# Patient Record
Sex: Male | Born: 1955 | Race: Asian | Hispanic: No | Marital: Married | Smoking: Never smoker
Health system: Southern US, Community
[De-identification: ages and names within clinical notes are randomized; demographics above are authoritative.]

## PROBLEM LIST (undated history)

## (undated) DIAGNOSIS — Z87891 Personal history of nicotine dependence: Secondary | ICD-10-CM

---

## 2005-08-31 ENCOUNTER — Emergency Department (HOSPITAL_COMMUNITY): Admission: EM | Admit: 2005-08-31 | Discharge: 2005-08-31 | Payer: Self-pay | Admitting: Family Medicine

## 2013-02-02 ENCOUNTER — Ambulatory Visit: Payer: BC Managed Care – PPO | Admitting: Family Medicine

## 2013-02-02 VITALS — BP 124/70 | HR 58 | Temp 97.8°F | Resp 18 | Ht 63.5 in | Wt 172.0 lb

## 2013-02-02 DIAGNOSIS — R05 Cough: Secondary | ICD-10-CM

## 2013-02-02 DIAGNOSIS — J209 Acute bronchitis, unspecified: Secondary | ICD-10-CM

## 2013-02-02 LAB — POCT CBC
Granulocyte percent: 47.7 %G (ref 37–80)
MCV: 87.4 fL (ref 80–97)
MID (cbc): 0.5 (ref 0–0.9)
MPV: 9.5 fL (ref 0–99.8)
POC Granulocyte: 3.1 (ref 2–6.9)
POC MID %: 8.2 %M (ref 0–12)
Platelet Count, POC: 197 10*3/uL (ref 142–424)
RBC: 5.34 M/uL (ref 4.69–6.13)

## 2013-02-02 MED ORDER — DOXYCYCLINE HYCLATE 100 MG PO TABS: 100.0000 mg | ORAL_TABLET | Freq: Two times a day (BID) | ORAL | Status: DC

## 2013-02-02 NOTE — Progress Notes (Signed)
[facility] [address] [place] [address] [phone]  Date:  02/02/2013   Name:  David Mcclure   DOB:  September 02, 1955   MRN:  [id number]  PCP:  No primary provider on file.    Chief Complaint: headache and cough   History of Present Illness:  David Mcclure is a 57 y.o. very pleasant male patient who presents with the following:  Patient in today with complaints of cough, runny nose, and headache or the past 2 days. He states that he is also unable to sleep at night. Patient denies that anyone else in the home is sick. Denies any travel outside of the local area. Denies exposure to animals. Patient denies any outside involvement recently. Patient denies any fever, states that he has had chills. Patient is Vietmanese- visit was conducted with an interpreter in the room.  His interpreter has an ID card identifying himself as a certified interpreter.    David Mcclure is otherwise generally healthy per his knowledge, NKDA.  There is some language barrier even with his interpreter present.  He has been taking some sort of medication- in a "blue box- ? Aleve.  Not sure when his last dose was taken.    There are no active problems to display for this patient.   History reviewed. No pertinent past medical history.  History reviewed. No pertinent past surgical history.  History  Substance Use Topics  . Smoking status: Never Smoker   . Smokeless tobacco: Not on file  . Alcohol Use: No    History reviewed. No pertinent family history.  Not on File  Medication list has been reviewed and updated.  No current outpatient prescriptions on file prior to visit.   No current facility-administered medications on file prior to visit.    Review of Systems:  Const: Positive for chills and headache. Unsure if any fever. Denies fatigue. Eyes: Denies eye pain or vision changes  ENT/Mouth: Denies ear pain. Positive for congestion, rhinorrhea, and sneezing. Able to tolerate foods and  fluids well.  CV: Denies palpations and chest pain. Resp: Positive for cough, non-productive. Denies shortness of breath.  GI: Denies nausea, vomiting, diarrhea, constipation.  GU: Denies any problems with urinaration Musculoskeletal: Positive for joint pain in the wrist and elbow area, not new Skin: Denies any changes or abnormalities.    Physical Examination: Filed Vitals:   02/02/13 1352  BP: 124/70  Pulse: 62  Temp: 97.8 F (36.6 C)  Resp: 18   Filed Vitals:   02/02/13 1352  Height: 5' 3.5" (1.613 m)  Weight: 172 lb (78.019 kg)   Body mass index is 29.99 kg/(m^2). Ideal Body Weight: Weight in (lb) to have BMI = 25: 143.1   Results for orders placed in visit on 02/02/13 (from the past 24 hour(s))  POCT CBC     Status: Abnormal   Collection Time    02/02/13  2:55 PM      Result Value Range   WBC 6.6  4.6 - 10.2 K/uL   Lymph, poc 2.9  0.6 - 3.4   POC LYMPH PERCENT 44.1  10 - 50 %L   MID (cbc) 0.5  0 - 0.9   POC MID % 8.2  0 - 12 %M   POC Granulocyte 3.1  2 - 6.9   Granulocyte percent 47.7  37 - 80 %G   RBC 5.34  4.69 - 6.13 M/uL   Hemoglobin 14.4  14.1 - 18.1 g/dL   HCT, POC  46.7  43.5 - 53.7 %   MCV 87.4  80 - 97 fL   MCH, POC 27.0  27 - 31.2 pg   MCHC 30.8 (*) 31.8 - 35.4 g/dL   RDW, POC 16.1     Platelet Count, POC 197  142 - 424 K/uL   MPV 9.5  0 - 99.8 fL   GEN: WDWN, NAD, Non-toxic, A & O x 3, looks well HEENT: Atraumatic, Normocephalic. Neck supple, thyroid normal size. No masses, No LAD.  Oropharynx wnl Ears and Nose: No external deformity. Tympanic membranes clear and relective to light CV:  No M/G/R. No JVD. No thrill. No extra heart sounds heard.  PULM: CTA B, no wheezes, crackles, rhonchi No retractions. No resp. distress. No accessory muscle use. (Dry cough noted) ABD: S, NT, ND, +BS. No rebound. No HSM. EXTR: No c/c/e NEURO Normal gait.  PSYCH: Not depressed or anxious appearing.  Calm demeanor.    Assessment and Plan: Assessment-  Bronchitis  As language barrier is an issue with cover with abx. We cannot know if he is running a fever, and follow- up by phone will be difficult.  Will use doxycycline to make sure we also cover RMSF.    Plan- Doxcycline 100mg  BID x10days         - Rest, drink plenty of fluids        - Follow up if symptoms worsen   Signed Abbe Amsterdam, MD

## 2013-02-02 NOTE — Patient Instructions (Addendum)
Use the doxycycline for your cough and bronchitis.  If you are not better in the next few days please let us know- Sooner if worse.

## 2018-12-24 DIAGNOSIS — R509 Fever, unspecified: Secondary | ICD-10-CM | POA: Diagnosis not present

## 2018-12-24 DIAGNOSIS — R05 Cough: Secondary | ICD-10-CM | POA: Diagnosis not present

## 2018-12-24 DIAGNOSIS — J209 Acute bronchitis, unspecified: Secondary | ICD-10-CM | POA: Diagnosis not present

## 2018-12-24 DIAGNOSIS — Z20828 Contact with and (suspected) exposure to other viral communicable diseases: Secondary | ICD-10-CM | POA: Diagnosis not present

## 2018-12-27 ENCOUNTER — Other Ambulatory Visit: Payer: Self-pay

## 2018-12-27 ENCOUNTER — Emergency Department (HOSPITAL_COMMUNITY)
Admission: EM | Admit: 2018-12-27 | Discharge: 2018-12-27 | Disposition: A | Discharge status: Home / Self Care | Payer: Medicare Other | Attending: Emergency Medicine | Admitting: Emergency Medicine

## 2018-12-27 ENCOUNTER — Encounter (HOSPITAL_COMMUNITY): Payer: Self-pay | Admitting: Emergency Medicine

## 2018-12-27 DIAGNOSIS — I10 Essential (primary) hypertension: Secondary | ICD-10-CM | POA: Diagnosis not present

## 2018-12-27 DIAGNOSIS — U071 COVID-19: Secondary | ICD-10-CM

## 2018-12-27 DIAGNOSIS — R05 Cough: Secondary | ICD-10-CM | POA: Diagnosis not present

## 2018-12-27 DIAGNOSIS — Z87891 Personal history of nicotine dependence: Secondary | ICD-10-CM | POA: Diagnosis not present

## 2018-12-27 DIAGNOSIS — R5381 Other malaise: Secondary | ICD-10-CM | POA: Diagnosis not present

## 2018-12-27 DIAGNOSIS — R079 Chest pain, unspecified: Secondary | ICD-10-CM | POA: Diagnosis not present

## 2018-12-27 HISTORY — DX: Personal history of nicotine dependence: Z87.891

## 2018-12-27 MED ORDER — ACETAMINOPHEN 325 MG PO TABS
650.0000 mg | ORAL_TABLET | Freq: Once | ORAL | Status: AC
  Administered 2018-12-27: 650 mg via ORAL
  Filled 2018-12-27: qty 2

## 2018-12-27 NOTE — ED Notes (Addendum)
Pt does not speak english and was unable to select appropriate ;language on interpretor. Family contacted on phone and daughter in law acted as Engineer, technical sales. Pt states he was told he is covid positive and came for check up. Denies chest pain or shortness of breath, c/o headache.

## 2018-12-27 NOTE — ED Provider Notes (Signed)
[facility] Provider Note   CSN: [id number] Arrival date & time: 12/27/18  1050   History   Chief Complaint Chief Complaint  Patient presents with  . Chest Pain    HPI David Mcclure is a 63 y.o. male.     HPI daughter used as Nurse, learning disabilitytranslator as language unavailable on language line  63 year old male resents today for reevaluation.  Patient's daughter notes that on Tuesday he was seen in the clinic for upper respiratory symptoms.  He had a COVID test performed, they received a phone call today stating that he is COVID positive.  They note he has had intermittent fevers at home.  He reports that he was feeling worse earlier in the week and now feels much improved with no significant complaints presently.  He notes a minor cough, no significant shortness of breath.  EMS reports mentions of chest pain, I asked the patient several times throughout the evaluation he denies any chest pain.  He is a former smoker but has no chronic health conditions but has not had formal health evaluation previously.    Past Medical History:  Diagnosis Date  . Former smoker     There are no active problems to display for this patient.   No past surgical history on file.     Home Medications    Prior to Admission medications   Medication Sig Start Date End Date Taking? Authorizing Provider  doxycycline (VIBRA-TABS) 100 MG tablet Take 1 tablet (100 mg total) by mouth 2 (two) times daily. 02/02/13   Copland, Gwenlyn FoundJessica C, MD    Family History No family history on file.  Social History Social History   Tobacco Use  . Smoking status: Never Smoker  Substance Use Topics  . Alcohol use: No  . Drug use: No     Allergies   Patient has no known allergies.   Review of Systems Review of Systems  All other systems reviewed and are negative.    Physical Exam Updated Vital Signs BP (!) 182/95 (BP Location: Right Arm)   Pulse 77   Temp 99.9 F (37.7 C) (Oral)    Resp (!) 30   Ht 5\' 8"  (1.727 m)   Wt 79.4 kg   SpO2 96%   BMI 26.61 kg/m   Physical Exam Vitals signs and nursing note reviewed.  Constitutional:      Appearance: He is well-developed.  HENT:     Head: Normocephalic and atraumatic.  Eyes:     General: No scleral icterus.       Right eye: No discharge.        Left eye: No discharge.     Conjunctiva/sclera: Conjunctivae normal.     Pupils: Pupils are equal, round, and reactive to light.  Neck:     Musculoskeletal: Normal range of motion.     Vascular: No JVD.     Trachea: No tracheal deviation.  Cardiovascular:     Rate and Rhythm: Normal rate and regular rhythm.  Pulmonary:     Effort: Pulmonary effort is normal. No respiratory distress.     Breath sounds: Normal breath sounds. No stridor. No wheezing, rhonchi or rales.  Neurological:     Mental Status: He is alert and oriented to person, place, and time.     Coordination: Coordination normal.  Psychiatric:        Behavior: Behavior normal.        Thought Content: Thought content normal.  Judgment: Judgment normal.      ED Treatments / Results  Labs (all labs ordered are listed, but only abnormal results are displayed) Labs Reviewed - No data to display  EKG None  Radiology No results found.  Procedures Procedures (including critical care time)  Medications Ordered in ED Medications  acetaminophen (TYLENOL) tablet 650 mg (650 mg Oral Given 12/27/18 1127)     Initial Impression / Assessment and Plan / ED Course  I have reviewed the triage vital signs and the nursing notes.  Pertinent labs & imaging results that were available during my care of the patient were reviewed by me and considered in my medical decision making (see chart for details).         Assessment/Plan: 64 year old male presents today for recheck of symptoms.  Patient notes improvement in symptoms, he is slightly febrile here with no signs of significant lower respiratory  involvement or shortness of breath.  He has no diagnosed past medical history.  Have a positive COVID test as an outpatient.  Patient will continue outpatient management with Tylenol as needed for fever, rest, return to the emergency room if he develops any new or worsening signs or symptoms.  The patient's family verbalized understanding and agreement to today's plan and had no further questions or concerns at the time of discharge.   Final Clinical Impressions(s) / ED Diagnoses   Final diagnoses:  COVID-19  Hypertension, unspecified type    ED Discharge Orders    None       Rosalio Loud 12/27/18 1625    Cathren Laine, MD 12/28/18 1009

## 2018-12-27 NOTE — ED Triage Notes (Signed)
Per EMS- pt is from home, reports feeling weak and feverish for past week. Pt has a cough. Pt c.o. chest pain starting today. No PMH. Hypertensive with EMS.

## 2018-12-27 NOTE — Discharge Instructions (Addendum)
Please read attached information. If you experience any new or worsening signs or symptoms please return to the emergency room for evaluation. Please follow-up with your primary care provider or specialist as discussed.  Your blood pressure is elevated today please inform primary care provider of this and discuss treatment options.  Please use tylenol as needed for fever.

## 2018-12-27 NOTE — ED Notes (Signed)
DC instructions discussed at length with pt and daughter in law over phone. All questions answered. Family is coming for pt. Pt given po fluids and tolerating well.

## 2019-01-06 NOTE — Progress Notes (Signed)
Patient ID: Elisee Hoopes, male   DOB: [birth year], 63 y.o.   MRN: [id number]  Virtual Visit via Telephone Note  I connected with Nyquan Mcwatters on 01/07/19 at  9:30 AM EDT by telephone and verified that I am speaking with the correct person using two identifiers.   I discussed the limitations, risks, security and privacy concerns of performing an evaluation and management service by telephone and the availability of in person appointments. I also discussed with the patient that there may be a patient responsible charge related to this service. The patient expressed understanding and agreed to proceed.  Patient location:  home My Location:  [facility] office Persons on the call:  Myself, daughter translating, and the patient  History of Present Illness: After being seen in the ED 12/27/2018 with Covid-19.  Still with cough and some headache.  Poor appetite.  No vomiting or diarrhea.  He is drinking liquids and his daughter is trying to get him to drink more liquids.  No fever.  Definitely much improved but not well.    From ED note: 63 year old male resents today for reevaluation.  Patient's daughter notes that on Tuesday he was seen in the clinic for upper respiratory symptoms.  He had a COVID test performed, they received a phone call today stating that he is COVID positive.  They note he has had intermittent fevers at home.  He reports that he was feeling worse earlier in the week and now feels much improved with no significant complaints presently.  He notes a minor cough, no significant shortness of breath.  EMS reports mentions of chest pain, I asked the patient several times throughout the evaluation he denies any chest pain.  He is a former smoker but has no chronic health conditions but has not had formal health evaluation previously  From A/P: Assessment/Plan: 63 year old male presents today for recheck of symptoms.  Patient notes improvement in symptoms, he is slightly febrile here with no signs of  significant lower respiratory involvement or shortness of breath.  He has no diagnosed past medical history.  Have a positive COVID test as an outpatient.  Patient will continue outpatient management with Tylenol as needed for fever, rest, return to the emergency room if he develops any new or worsening signs or symptoms.  The patient's family verbalized understanding and agreement to today's plan and had no further questions or concerns at the time of discharge.    Observations/Objective: A&Ox3.     Assessment and Plan: 1. COVID-19 virus infection Much improved.  Fluids, hydration imperative.  Discussed adequate nutrition and hydration is of paramount importance.  Avoid NSAIDS.  Tylenol for HA, OTC cough meds ok.    2. Encounter for examination following treatment at hospital Much improved  3. Language barrier Jari Pigg attempted but family member interpretered used and additional time performing visit was required.   To ED if worsens  Follow Up Instructions: 1 month to assign PCP   I discussed the assessment and treatment plan with the patient. The patient was provided an opportunity to ask questions and all were answered. The patient agreed with the plan and demonstrated an understanding of the instructions.   The patient was advised to call back or seek an in-person evaluation if the symptoms worsen or if the condition fails to improve as anticipated.  I provided 12 minutes of non-face-to-face time during this encounter.   Georgian Co, PA-C

## 2019-01-07 ENCOUNTER — Ambulatory Visit: Payer: Medicare Other | Attending: Family Medicine | Admitting: Physician Assistant

## 2019-01-07 DIAGNOSIS — U071 COVID-19: Secondary | ICD-10-CM

## 2019-01-07 DIAGNOSIS — Z789 Other specified health status: Secondary | ICD-10-CM

## 2019-01-07 DIAGNOSIS — Z09 Encounter for follow-up examination after completed treatment for conditions other than malignant neoplasm: Secondary | ICD-10-CM | POA: Diagnosis not present

## 2019-01-07 DIAGNOSIS — Z603 Acculturation difficulty: Secondary | ICD-10-CM

## 2019-02-23 ENCOUNTER — Ambulatory Visit (HOSPITAL_COMMUNITY)
Admission: EM | Admit: 2019-02-23 | Discharge: 2019-02-23 | Disposition: A | Discharge status: Home / Self Care | Payer: Medicare Other | Attending: Urgent Care | Admitting: Urgent Care

## 2019-02-23 ENCOUNTER — Other Ambulatory Visit: Payer: Self-pay

## 2019-02-23 ENCOUNTER — Encounter (HOSPITAL_COMMUNITY): Payer: Self-pay

## 2019-02-23 DIAGNOSIS — R030 Elevated blood-pressure reading, without diagnosis of hypertension: Secondary | ICD-10-CM

## 2019-02-23 DIAGNOSIS — I10 Essential (primary) hypertension: Secondary | ICD-10-CM | POA: Diagnosis not present

## 2019-02-23 MED ORDER — AMLODIPINE BESYLATE 5 MG PO TABS: 5.0000 mg | ORAL_TABLET | Freq: Every day | ORAL | 0 refills | Status: AC

## 2019-02-23 NOTE — ED Provider Notes (Signed)
MRN: [id number] DOB: [birth year]  Subjective:   David Mcclure is a 63 y.o. male presenting for medication to help with his blood pressure.  Patient reports that he has been checking it on his own and has been high.  He does not have a PCP.  He would like to get a medication for his blood pressure.  Denies smoking cigarettes or drinking alcohol.  Denies having any particular symptoms.  However, he has had an intermittent mild headache in the past week.  No current facility-administered medications for this encounter.  No current outpatient medications on file.   No Known Allergies  Past Medical History:  Diagnosis Date  . Former smoker      History reviewed. No pertinent surgical history.  Review of Systems  Constitutional: Negative for fever and malaise/fatigue.  HENT: Negative for congestion, ear pain, sinus pain and sore throat.   Eyes: Negative for blurred vision, double vision, discharge and redness.  Respiratory: Negative for cough, hemoptysis, shortness of breath and wheezing.   Cardiovascular: Negative for chest pain.  Gastrointestinal: Negative for abdominal pain, diarrhea, nausea and vomiting.  Genitourinary: Negative for dysuria, flank pain and hematuria.  Musculoskeletal: Negative for myalgias.  Skin: Negative for rash.  Neurological: Negative for dizziness, weakness and headaches.  Psychiatric/Behavioral: Negative for depression and substance abuse.    Objective:   Vitals: BP (!) 146/87 (BP Location: Left Arm)   Pulse 83   Temp 99 F (37.2 C) (Oral)   Resp 17   SpO2 97%   BP Readings from Last 3 Encounters:  02/23/19 (!) 146/87  12/27/18 (!) 182/95  02/02/13 124/70   Physical Exam Constitutional:      General: He is not in acute distress.    Appearance: Normal appearance. He is well-developed. He is not ill-appearing, toxic-appearing or diaphoretic.  HENT:     Head: Normocephalic and atraumatic.     Right Ear: External ear normal.     Left Ear: External  ear normal.     Nose: Nose normal.     Mouth/Throat:     Mouth: Mucous membranes are moist.     Pharynx: Oropharynx is clear.  Eyes:     General: No scleral icterus.    Extraocular Movements: Extraocular movements intact.     Pupils: Pupils are equal, round, and reactive to light.  Cardiovascular:     Rate and Rhythm: Normal rate and regular rhythm.     Heart sounds: Normal heart sounds. No murmur. No friction rub. No gallop.   Pulmonary:     Effort: Pulmonary effort is normal. No respiratory distress.     Breath sounds: Normal breath sounds. No stridor. No wheezing, rhonchi or rales.  Neurological:     General: No focal deficit present.     Mental Status: He is alert and oriented to person, place, and time.     Cranial Nerves: No cranial nerve deficit.     Motor: No weakness.     Coordination: Coordination normal.     Gait: Gait normal.     Deep Tendon Reflexes: Reflexes normal.  Psychiatric:        Mood and Affect: Mood normal.        Behavior: Behavior normal.        Thought Content: Thought content normal.     Assessment and Plan :   1. Essential hypertension   2. Elevated blood pressure reading     We will have patient start amlodipine.  Counseled on need for dietary  modifications. Counseled on need to establish with a PCP for further work-up and follow-up.  I placed patient in a work you with [facility] for PCP assistance. Counseled patient on potential for adverse effects with medications prescribed/recommended today, ER and return-to-clinic precautions discussed, patient verbalized understanding.     Wallis BambergMani, Eltha Tingley, PA-C 02/23/19 1755

## 2019-02-23 NOTE — Discharge Instructions (Addendum)
For elevated blood pressure, make sure you are monitoring salt in your diet.  Do not eat restaurant foods and limit processed foods at home.  Processed foods include things like frozen meals preseason meats and dinners.  Make sure your pain attention to sodium labels on foods you by at the grocery store.  For seasoning you can use a brand called Mrs. Dash which includes a lot of salt free seasonings. ° °Salads - kale, spinach, cabbage, spring mix; use seeds like pumpkin seeds or sunflower seeds, almonds; you can also use 1-2 hard boiled eggs in your salads °Fruits - avocadoes, berries (blueberries, raspberries, blackberries), apples, oranges, pomegranate, grapefruit °Vegetables - aspargus, cauliflower, broccoli, green beans, brussel spouts, bell peppers; stay away from starchy vegetables like potatoes, carrots, peas ° °

## 2019-02-23 NOTE — ED Triage Notes (Signed)
Patient presents to Urgent Care with complaints of hypertension since measuring it in the pharmacy today. Patient reports he has a slight headache as well.

## 2019-03-20 DIAGNOSIS — Z131 Encounter for screening for diabetes mellitus: Secondary | ICD-10-CM | POA: Diagnosis not present

## 2019-03-20 DIAGNOSIS — Z136 Encounter for screening for cardiovascular disorders: Secondary | ICD-10-CM | POA: Diagnosis not present

## 2019-03-20 DIAGNOSIS — Z1159 Encounter for screening for other viral diseases: Secondary | ICD-10-CM | POA: Diagnosis not present

## 2019-03-20 DIAGNOSIS — E7800 Pure hypercholesterolemia, unspecified: Secondary | ICD-10-CM | POA: Diagnosis not present

## 2019-03-20 DIAGNOSIS — R5383 Other fatigue: Secondary | ICD-10-CM | POA: Diagnosis not present

## 2019-03-20 DIAGNOSIS — E559 Vitamin D deficiency, unspecified: Secondary | ICD-10-CM | POA: Diagnosis not present

## 2019-03-20 DIAGNOSIS — Z0000 Encounter for general adult medical examination without abnormal findings: Secondary | ICD-10-CM | POA: Diagnosis not present

## 2019-03-20 DIAGNOSIS — M129 Arthropathy, unspecified: Secondary | ICD-10-CM | POA: Diagnosis not present

## 2019-03-20 DIAGNOSIS — Z79899 Other long term (current) drug therapy: Secondary | ICD-10-CM | POA: Diagnosis not present

## 2019-04-03 DIAGNOSIS — R030 Elevated blood-pressure reading, without diagnosis of hypertension: Secondary | ICD-10-CM | POA: Diagnosis not present

## 2019-04-03 DIAGNOSIS — R413 Other amnesia: Secondary | ICD-10-CM | POA: Diagnosis not present

## 2019-04-03 DIAGNOSIS — M79605 Pain in left leg: Secondary | ICD-10-CM | POA: Diagnosis not present

## 2019-04-03 DIAGNOSIS — E7800 Pure hypercholesterolemia, unspecified: Secondary | ICD-10-CM | POA: Diagnosis not present

## 2019-04-16 DIAGNOSIS — E7800 Pure hypercholesterolemia, unspecified: Secondary | ICD-10-CM | POA: Diagnosis not present

## 2019-04-16 DIAGNOSIS — J3089 Other allergic rhinitis: Secondary | ICD-10-CM | POA: Diagnosis not present

## 2019-04-16 DIAGNOSIS — R0989 Other specified symptoms and signs involving the circulatory and respiratory systems: Secondary | ICD-10-CM | POA: Diagnosis not present

## 2019-04-16 DIAGNOSIS — R946 Abnormal results of thyroid function studies: Secondary | ICD-10-CM | POA: Diagnosis not present

## 2019-04-16 DIAGNOSIS — M79605 Pain in left leg: Secondary | ICD-10-CM | POA: Diagnosis not present

## 2019-04-16 DIAGNOSIS — M109 Gout, unspecified: Secondary | ICD-10-CM | POA: Diagnosis not present

## 2019-04-16 DIAGNOSIS — M79604 Pain in right leg: Secondary | ICD-10-CM | POA: Diagnosis not present

## 2019-05-16 ENCOUNTER — Emergency Department (HOSPITAL_COMMUNITY)
Admission: EM | Admit: 2019-05-16 | Discharge: 2019-05-16 | Disposition: A | Discharge status: Home / Self Care | Payer: Medicare Other | Attending: Emergency Medicine | Admitting: Emergency Medicine

## 2019-05-16 ENCOUNTER — Encounter (HOSPITAL_COMMUNITY): Payer: Self-pay

## 2019-05-16 ENCOUNTER — Emergency Department (HOSPITAL_COMMUNITY): Payer: Medicare Other

## 2019-05-16 ENCOUNTER — Other Ambulatory Visit: Payer: Self-pay

## 2019-05-16 DIAGNOSIS — Z79899 Other long term (current) drug therapy: Secondary | ICD-10-CM | POA: Insufficient documentation

## 2019-05-16 DIAGNOSIS — Y999 Unspecified external cause status: Secondary | ICD-10-CM | POA: Insufficient documentation

## 2019-05-16 DIAGNOSIS — M25512 Pain in left shoulder: Secondary | ICD-10-CM | POA: Insufficient documentation

## 2019-05-16 DIAGNOSIS — Y9389 Activity, other specified: Secondary | ICD-10-CM | POA: Insufficient documentation

## 2019-05-16 DIAGNOSIS — Y92410 Unspecified street and highway as the place of occurrence of the external cause: Secondary | ICD-10-CM | POA: Diagnosis not present

## 2019-05-16 DIAGNOSIS — S20211A Contusion of right front wall of thorax, initial encounter: Secondary | ICD-10-CM | POA: Insufficient documentation

## 2019-05-16 DIAGNOSIS — S0990XA Unspecified injury of head, initial encounter: Secondary | ICD-10-CM | POA: Diagnosis not present

## 2019-05-16 DIAGNOSIS — S299XXA Unspecified injury of thorax, initial encounter: Secondary | ICD-10-CM | POA: Diagnosis not present

## 2019-05-16 DIAGNOSIS — Z87891 Personal history of nicotine dependence: Secondary | ICD-10-CM | POA: Diagnosis not present

## 2019-05-16 DIAGNOSIS — R519 Headache, unspecified: Secondary | ICD-10-CM | POA: Insufficient documentation

## 2019-05-16 DIAGNOSIS — I10 Essential (primary) hypertension: Secondary | ICD-10-CM | POA: Diagnosis not present

## 2019-05-16 DIAGNOSIS — M542 Cervicalgia: Secondary | ICD-10-CM | POA: Insufficient documentation

## 2019-05-16 DIAGNOSIS — R52 Pain, unspecified: Secondary | ICD-10-CM | POA: Diagnosis not present

## 2019-05-16 DIAGNOSIS — M25519 Pain in unspecified shoulder: Secondary | ICD-10-CM | POA: Diagnosis not present

## 2019-05-16 MED ORDER — NAPROXEN 375 MG PO TABS: 375.0000 mg | ORAL_TABLET | Freq: Two times a day (BID) | ORAL | 0 refills | Status: AC

## 2019-05-16 MED ORDER — HYDROCODONE-ACETAMINOPHEN 5-325 MG PO TABS
1.0000 | ORAL_TABLET | Freq: Once | ORAL | Status: AC
  Administered 2019-05-16: 07:00:00 1 via ORAL
  Filled 2019-05-16: qty 1

## 2019-05-16 MED ORDER — ACETAMINOPHEN ER 650 MG PO TBCR: 650.0000 mg | EXTENDED_RELEASE_TABLET | Freq: Three times a day (TID) | ORAL | 0 refills | Status: DC | PRN
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
## 2019-05-16 NOTE — ED Notes (Signed)
Pt was verbalized discharge instructions. Pt had no further questions at this time. NAD. 

## 2019-05-16 NOTE — ED Notes (Signed)
Pt ambulated to the bathroom without assistance. Gait steady  

## 2019-08-21 DIAGNOSIS — Z131 Encounter for screening for diabetes mellitus: Secondary | ICD-10-CM | POA: Diagnosis not present

## 2019-08-21 DIAGNOSIS — M109 Gout, unspecified: Secondary | ICD-10-CM | POA: Diagnosis not present

## 2019-08-21 DIAGNOSIS — Z1159 Encounter for screening for other viral diseases: Secondary | ICD-10-CM | POA: Diagnosis not present

## 2019-08-21 DIAGNOSIS — Z79899 Other long term (current) drug therapy: Secondary | ICD-10-CM | POA: Diagnosis not present

## 2019-08-21 DIAGNOSIS — M79605 Pain in left leg: Secondary | ICD-10-CM | POA: Diagnosis not present

## 2019-08-21 DIAGNOSIS — E7800 Pure hypercholesterolemia, unspecified: Secondary | ICD-10-CM | POA: Diagnosis not present

## 2019-10-02 DIAGNOSIS — Z20822 Contact with and (suspected) exposure to covid-19: Secondary | ICD-10-CM | POA: Diagnosis not present

## 2019-10-02 DIAGNOSIS — Z9189 Other specified personal risk factors, not elsewhere classified: Secondary | ICD-10-CM | POA: Diagnosis not present

## 2019-10-03 DIAGNOSIS — E559 Vitamin D deficiency, unspecified: Secondary | ICD-10-CM | POA: Diagnosis not present

## 2019-10-03 DIAGNOSIS — E7800 Pure hypercholesterolemia, unspecified: Secondary | ICD-10-CM | POA: Diagnosis not present

## 2019-10-03 DIAGNOSIS — I10 Essential (primary) hypertension: Secondary | ICD-10-CM | POA: Diagnosis not present

## 2019-10-03 DIAGNOSIS — Z79899 Other long term (current) drug therapy: Secondary | ICD-10-CM | POA: Diagnosis not present

## 2019-10-03 DIAGNOSIS — Z1159 Encounter for screening for other viral diseases: Secondary | ICD-10-CM | POA: Diagnosis not present

## 2019-10-03 DIAGNOSIS — R030 Elevated blood-pressure reading, without diagnosis of hypertension: Secondary | ICD-10-CM | POA: Diagnosis not present

## 2019-10-14 DIAGNOSIS — R9431 Abnormal electrocardiogram [ECG] [EKG]: Secondary | ICD-10-CM | POA: Diagnosis not present

## 2019-11-02 DIAGNOSIS — M79604 Pain in right leg: Secondary | ICD-10-CM | POA: Diagnosis not present

## 2019-11-02 DIAGNOSIS — E7800 Pure hypercholesterolemia, unspecified: Secondary | ICD-10-CM | POA: Diagnosis not present

## 2019-11-02 DIAGNOSIS — M79605 Pain in left leg: Secondary | ICD-10-CM | POA: Diagnosis not present

## 2019-11-02 DIAGNOSIS — I10 Essential (primary) hypertension: Secondary | ICD-10-CM | POA: Diagnosis not present

## 2019-12-30 DIAGNOSIS — M79604 Pain in right leg: Secondary | ICD-10-CM | POA: Diagnosis not present

## 2019-12-30 DIAGNOSIS — E7800 Pure hypercholesterolemia, unspecified: Secondary | ICD-10-CM | POA: Diagnosis not present

## 2019-12-30 DIAGNOSIS — I10 Essential (primary) hypertension: Secondary | ICD-10-CM | POA: Diagnosis not present

## 2019-12-30 DIAGNOSIS — M79605 Pain in left leg: Secondary | ICD-10-CM | POA: Diagnosis not present

## 2020-01-29 DIAGNOSIS — R5383 Other fatigue: Secondary | ICD-10-CM | POA: Diagnosis not present

## 2020-01-29 DIAGNOSIS — E7800 Pure hypercholesterolemia, unspecified: Secondary | ICD-10-CM | POA: Diagnosis not present

## 2020-01-29 DIAGNOSIS — Z79899 Other long term (current) drug therapy: Secondary | ICD-10-CM | POA: Diagnosis not present

## 2020-01-29 DIAGNOSIS — Z1159 Encounter for screening for other viral diseases: Secondary | ICD-10-CM | POA: Diagnosis not present

## 2020-01-29 DIAGNOSIS — I10 Essential (primary) hypertension: Secondary | ICD-10-CM | POA: Diagnosis not present

## 2020-01-29 DIAGNOSIS — E559 Vitamin D deficiency, unspecified: Secondary | ICD-10-CM | POA: Diagnosis not present
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
## 2020-04-25 DIAGNOSIS — Z9189 Other specified personal risk factors, not elsewhere classified: Secondary | ICD-10-CM | POA: Diagnosis not present

## 2020-04-25 DIAGNOSIS — R519 Headache, unspecified: Secondary | ICD-10-CM | POA: Diagnosis not present

## 2020-05-09 DIAGNOSIS — E559 Vitamin D deficiency, unspecified: Secondary | ICD-10-CM | POA: Diagnosis not present

## 2020-05-09 DIAGNOSIS — E7800 Pure hypercholesterolemia, unspecified: Secondary | ICD-10-CM | POA: Diagnosis not present

## 2020-05-09 DIAGNOSIS — M129 Arthropathy, unspecified: Secondary | ICD-10-CM | POA: Diagnosis not present

## 2020-05-09 DIAGNOSIS — I10 Essential (primary) hypertension: Secondary | ICD-10-CM | POA: Diagnosis not present

## 2020-05-09 DIAGNOSIS — Z1159 Encounter for screening for other viral diseases: Secondary | ICD-10-CM | POA: Diagnosis not present

## 2020-05-09 DIAGNOSIS — Z23 Encounter for immunization: Secondary | ICD-10-CM | POA: Diagnosis not present

## 2020-05-09 DIAGNOSIS — R5383 Other fatigue: Secondary | ICD-10-CM | POA: Diagnosis not present

## 2020-05-09 DIAGNOSIS — Z79899 Other long term (current) drug therapy: Secondary | ICD-10-CM | POA: Diagnosis not present

## 2020-05-09 DIAGNOSIS — Z0000 Encounter for general adult medical examination without abnormal findings: Secondary | ICD-10-CM | POA: Diagnosis not present

## 2023-09-12 ENCOUNTER — Ambulatory Visit: Payer: 59 | Attending: Audiology | Admitting: Audiology

## 2023-10-09 ENCOUNTER — Emergency Department (HOSPITAL_COMMUNITY)
Admission: EM | Admit: 2023-10-09 | Discharge: 2023-10-09 | Disposition: A | Discharge status: Home / Self Care | Payer: 59 | Attending: Emergency Medicine | Admitting: Emergency Medicine

## 2023-10-09 ENCOUNTER — Encounter (HOSPITAL_COMMUNITY): Payer: Self-pay

## 2023-10-09 ENCOUNTER — Emergency Department (HOSPITAL_COMMUNITY): Payer: 59

## 2023-10-09 ENCOUNTER — Other Ambulatory Visit: Payer: Self-pay

## 2023-10-09 DIAGNOSIS — I159 Secondary hypertension, unspecified: Secondary | ICD-10-CM | POA: Diagnosis not present

## 2023-10-09 DIAGNOSIS — Z79899 Other long term (current) drug therapy: Secondary | ICD-10-CM | POA: Insufficient documentation

## 2023-10-09 DIAGNOSIS — R519 Headache, unspecified: Secondary | ICD-10-CM

## 2023-10-09 DIAGNOSIS — B349 Viral infection, unspecified: Secondary | ICD-10-CM | POA: Diagnosis not present

## 2023-10-09 DIAGNOSIS — R509 Fever, unspecified: Secondary | ICD-10-CM | POA: Insufficient documentation

## 2023-10-09 LAB — BASIC METABOLIC PANEL
Anion gap: 11 (ref 5–15)
BUN: 17 mg/dL (ref 8–23)
CO2: 25 mmol/L (ref 22–32)
Calcium: 9.1 mg/dL (ref 8.9–10.3)
Chloride: 98 mmol/L (ref 98–111)
Creatinine, Ser: 1.54 mg/dL — ABNORMAL HIGH (ref 0.61–1.24)
GFR, Estimated: 48 mL/min — ABNORMAL LOW (ref >60)
Glucose, Bld: 120 mg/dL — ABNORMAL HIGH (ref 70–99)
Potassium: 3.6 mmol/L (ref 3.5–5.1)
Sodium: 134 mmol/L — ABNORMAL LOW (ref 135–145)

## 2023-10-09 LAB — CBC WITH DIFFERENTIAL/PLATELET
Abs Immature Granulocytes: 0.03 10*3/uL (ref 0.00–0.07)
Basophils Absolute: 0.1 10*3/uL (ref 0.0–0.1)
Basophils Relative: 0 %
Eosinophils Absolute: 0 10*3/uL (ref 0.0–0.5)
Eosinophils Relative: 0 %
HCT: 42.6 % (ref 39.0–52.0)
Hemoglobin: 14 g/dL (ref 13.0–17.0)
Immature Granulocytes: 0 %
Lymphocytes Relative: 17 %
Lymphs Abs: 2.1 10*3/uL (ref 0.7–4.0)
MCH: 26.5 pg (ref 26.0–34.0)
MCHC: 32.9 g/dL (ref 30.0–36.0)
MCV: 80.5 fL (ref 80.0–100.0)
Monocytes Absolute: 1 10*3/uL (ref 0.1–1.0)
Monocytes Relative: 8 %
Neutro Abs: 9.2 10*3/uL — ABNORMAL HIGH (ref 1.7–7.7)
Neutrophils Relative %: 75 %
Platelets: 200 10*3/uL (ref 150–400)
RBC: 5.29 MIL/uL (ref 4.22–5.81)
RDW: 12.9 % (ref 11.5–15.5)
WBC: 12.3 10*3/uL — ABNORMAL HIGH (ref 4.0–10.5)
nRBC: 0 % (ref 0.0–0.2)

## 2023-10-09 LAB — RESP PANEL BY RT-PCR (RSV, FLU A&B, COVID)  RVPGX2
Influenza A by PCR: NEGATIVE
Influenza B by PCR: NEGATIVE
Resp Syncytial Virus by PCR: NEGATIVE
SARS Coronavirus 2 by RT PCR: NEGATIVE

## 2023-10-09 MED ORDER — ACETAMINOPHEN 500 MG PO TABS: 500.0000 mg | ORAL_TABLET | ORAL | 0 refills | Status: AC | PRN

## 2023-10-09 MED ORDER — ACETAMINOPHEN 325 MG PO TABS
650.0000 mg | ORAL_TABLET | Freq: Once | ORAL | Status: AC
  Administered 2023-10-09: 650 mg via ORAL
  Filled 2023-10-09: qty 2

## 2023-10-09 NOTE — ED Provider Triage Note (Signed)
 Emergency Medicine Provider Triage Evaluation Note  Rocco Fusselman , a 68 y.o. male  was evaluated in triage.  Pt complains of headache and high blood pressure.  Reports compliance on blood pressure medication.  States he developed a headache last night, not worse headache of his life.  Denies chest pain, shortness of breath or blurred vision.  Reports he has pain "all over my body".  Denies known sick contacts.  Denies fevers at home.  Review of Systems  Positive:  Negative:   Physical Exam  BP (!) 175/93 (BP Location: Left Arm)   Pulse 89   Temp 100.3 F (37.9 C) (Oral)   Resp (!) 22   Ht 5\' 8"  (1.727 m)   Wt 79.4 kg   SpO2 94%   BMI 26.62 kg/m  Gen:   Awake, no distress   Resp:  Normal effort  MSK:   Moves extremities without difficulty  Other:  No focal neurodeficits  Medical Decision Making  Medically screening exam initiated at 3:00 PM.  Appropriate orders placed.  Vito Marinello was informed that the remainder of the evaluation will be completed by another provider, this initial triage assessment does not replace that evaluation, and the importance of remaining in the ED until their evaluation is complete.     Al Decant, PA-C 10/09/23 1500

## 2023-10-09 NOTE — ED Notes (Signed)
 Patient appeared to tolerate ambulation well. O 2 sat remained between 91-95%.

## 2023-10-09 NOTE — ED Triage Notes (Signed)
 Hypertension at home for the last few days. Pt c/o headache, body aches, and fever. Pt is on amlodipine for BP and has not missed any doses.

## 2023-10-09 NOTE — Discharge Instructions (Addendum)
 We saw in the emergency room for headache, generalized weakness.  The workup in the emergency room is reassuring.  CT scan of the brain is normal.  COVID-19, flu is negative.  Chest x-ray shows no pneumonia.  Blood work shows normal renal function.  We suspect that most likely have a viral illness that is probably causing the weakness, headaches and the low-grade fever.  This also might be contributing to your blood pressure.  Take Tylenol for fevers. Start taking her blood pressure regularly and writing it down.  Follow-up with your blood pressure log with to your primary care doctor.  Please return to the ER if your symptoms worsen; you have increased pain, fevers, chills, inability to keep any medications down, confusion. Otherwise see the outpatient doctor as requested.

## 2023-10-09 NOTE — ED Provider Notes (Signed)
 [facility] Provider Note   CSN: [id number] Arrival date & time: 10/09/23  1358     History  Chief Complaint  Patient presents with   Hypertension    David Mcclure is a 68 y.o. male.  HPI    68 year old patient comes into the emergency room with his daughter with chief complaint of headache.  Patient and the daughter declined translation service.  The daughter is comfortable translating for the patient.  According to the patient, he starting headache yesterday.  Headache is described as 5 out of 10, frontal headache that moved to the vertex region.  Headache is fairly constant.  He did take some ibuprofen yesterday with mild relief.  He woke up this morning and still had a headache, therefore he checked his blood pressure and was running high.  The highest blood pressure today was 200 systolic.  Patient has past medical history of hypertension.  Patient denies any associated nausea, vomiting, numbness, one-sided weakness, tingling, slurred speech. He has no history of heart attack or stroke.  He denies any chest pain, shortness of breath.  Review of system is positive for some congestion, cough, malaise.  Patient is not taking any decongestants right now.  Home Medications Prior to Admission medications   Medication Sig Start Date End Date Taking? Authorizing Provider  acetaminophen (TYLENOL) 500 MG tablet Take 1 tablet (500 mg total) by mouth every 4 (four) hours as needed. 10/09/23  Yes Teisha Trowbridge, MD  amLODipine (NORVASC) 5 MG tablet Take 1 tablet (5 mg total) by mouth daily. 02/23/19   Wallis Bamberg, PA-C  naproxen (NAPROSYN) 375 MG tablet Take 1 tablet (375 mg total) by mouth 2 (two) times daily. 05/16/19   Derwood Kaplan, MD      Allergies    Patient has no known allergies.    Review of Systems   Review of Systems  All other systems reviewed and are negative.   Physical Exam Updated Vital Signs BP (!) 163/94   Pulse 96    Temp (!) 100.9 F (38.3 C) (Oral)   Resp (!) 23   Ht 5\' 8"  (1.727 m)   Wt 79.4 kg   SpO2 93%   BMI 26.62 kg/m  Physical Exam Vitals and nursing note reviewed.  Constitutional:      Appearance: He is well-developed.  HENT:     Head: Atraumatic.  Eyes:     Extraocular Movements: Extraocular movements intact.     Pupils: Pupils are equal, round, and reactive to light.     Comments: Peripheral visual fields intact, no nystagmus  Cardiovascular:     Rate and Rhythm: Normal rate.  Pulmonary:     Effort: Pulmonary effort is normal.     Breath sounds: No wheezing or rhonchi.  Musculoskeletal:     Cervical back: Neck supple.  Skin:    General: Skin is warm.  Neurological:     Mental Status: He is alert and oriented to person, place, and time.     Cranial Nerves: No cranial nerve deficit.     Sensory: No sensory deficit.     Motor: No weakness.     Coordination: Coordination normal.     ED Results / Procedures / Treatments   Labs (all labs ordered are listed, but only abnormal results are displayed) Labs Reviewed  BASIC METABOLIC PANEL - Abnormal; Notable for the following components:      Result Value   Sodium 134 (*)  Glucose, Bld 120 (*)    Creatinine, Ser 1.54 (*)    GFR, Estimated 48 (*)    All other components within normal limits  CBC WITH DIFFERENTIAL/PLATELET - Abnormal; Notable for the following components:   WBC 12.3 (*)    Neutro Abs 9.2 (*)    All other components within normal limits  RESP PANEL BY RT-PCR (RSV, FLU A&B, COVID)  [id number]    EKG EKG Interpretation Date/Time:  Wednesday October 09 2023 14:29:34 EST Ventricular Rate:  89 PR Interval:  152 QRS Duration:  86 QT Interval:  355 QTC Calculation: 432 R Axis:   -23  Text Interpretation: Sinus rhythm Abnormal R-wave progression, early transition Left ventricular hypertrophy No acute changes ST elevation - early repo No old tracing to compare Confirmed by Derwood Kaplan [phone]) on 10/09/2023  9:03:21 PM  Radiology DG Chest Port 1 View Result Date: 10/09/2023 CLINICAL DATA:  Cough, fever, hypertension EXAM: PORTABLE CHEST 1 VIEW COMPARISON:  05/16/2019 FINDINGS: Single frontal view of the chest demonstrates an unremarkable cardiac silhouette. No airspace disease, effusion, or pneumothorax. No acute bony abnormalities. IMPRESSION: 1. No acute intrathoracic process. Electronically Signed   By: Sharlet Salina M.D.   On: 10/09/2023 22:17   CT Head Wo Contrast Result Date: 10/09/2023 CLINICAL DATA:  Provided history: Headache, increasing frequency or severity. EXAM: CT HEAD WITHOUT CONTRAST TECHNIQUE: Contiguous axial images were obtained from the base of the skull through the vertex without intravenous contrast. RADIATION DOSE REDUCTION: This exam was performed according to the departmental dose-optimization program which includes automated exposure control, adjustment of the mA and/or kV according to patient size and/or use of iterative reconstruction technique. COMPARISON:  Head CT 05/16/2019. FINDINGS: Brain: No age-advanced or lobar predominant cerebral atrophy. Focus of ill-defined hypoattenuation within the right parietal lobe subcortical white matter, unchanged from the prior head CT of 05/16/2019. Mild patchy and ill-defined hypoattenuation elsewhere within the cerebral white matter. There is no acute intracranial hemorrhage. No demarcated cortical infarct. No extra-axial fluid collection. No evidence of an intracranial mass. No midline shift. Vascular: No hyperdense vessel.  Atherosclerotic calcifications. Skull: No calvarial fracture or aggressive osseous lesion. Sinuses/Orbits: No mass or acute finding within the imaged orbits. Moderate mucosal thickening within the right maxillary sinus at the imaged levels. Minimal mucosal thickening within bilateral ethmoid air cells. IMPRESSION: 1.  No evidence of an acute intracranial abnormality. 2. 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 3. Paranasal sinus disease at the imaged levels, as described. Electronically Signed   By: Jackey Loge D.O.   On: 10/09/2023 16:40    Procedures Procedures    Medications Ordered in ED Medications  acetaminophen (TYLENOL) tablet 650 mg (650 mg Oral Given 10/09/23 2107)    ED Course/ Medical Decision Making/ A&P                                 Medical Decision Making Amount and/or Complexity of Data Reviewed Labs: ordered. Radiology: ordered.  Risk OTC drugs.  68 year old male comes in with chief complaint of headache and elevated blood pressure.  He has previous history of hypertension only.  Patient speaks month and year only, has daughter translate for him.  They declined the translation services.  Collateral history provided by patient's daughter.  It appears that patient's having headache yesterday/last night.  The headache has been constant, and he does not  have any previous history of similar headaches.  No associated neurodeficits.  Neuroexam is reassuring.  Patient has no meningismus.  Differential diagnosis considered for this patient includes: Primary headaches - including migrainous headaches, cluster headaches, tension headaches. ICH Tumor Vascular headaches AV malformation Brain aneurysm Muscular headaches Influenza/COVID/viral illness  A/P: Pt comes in with cc of headaches. No concerns for life threatening secondary headaches based on history and exam.  Neurodeficits is reassuring.  Headache at its peak is 5 or 6 out of 10.  Given that this is new onset headache in someone at age 34, we will get CT scan of the brain.  Patient also has high blood pressure.  In the setting of reassuring neuroexam, doubt that this is rebound hypertension from stroke.  Blood pressure could be elevated secondary to the headache or viral illness.  Will get basic labs.  Patient has no chest pain, shortness of  breath, no indication for chest x-ray or EKG at this time.  10:55 PM CT scan of the brain independently interpreted, no evidence of brain bleed. Lab workup independently interpreted, normal renal function and electrolytes noted.  Patient now has a low-grade fever.  We will get x-ray of the chest.  O2 sats about 90%.  Ambulatory pulse ox also ordered.  His COVID-19, flu test is already negative. Patient reassessed.  States that he feels better, wants to go home.  Ambulatory pulse ox was reassuring.  Family at the bedside, I discussed with them the findings here and also discussed return precautions. Final Clinical Impression(s) / ED Diagnoses Final diagnoses:  Viral illness  Secondary hypertension  Generalized headache    Rx / DC Orders ED Discharge Orders          Ordered    acetaminophen (TYLENOL) 500 MG tablet  Every 4 hours PRN        10/09/23 2149              Derwood Kaplan, MD 10/09/23 2255
